# Patient Record
Sex: Male | Born: 1975 | Race: Black or African American | Hispanic: No | Marital: Married | Smoking: Current every day smoker
Health system: Southern US, Community
[De-identification: ages and names within clinical notes are randomized; demographics above are authoritative.]

## PROBLEM LIST (undated history)

## (undated) DIAGNOSIS — I10 Essential (primary) hypertension: Secondary | ICD-10-CM

---

## 2011-02-16 ENCOUNTER — Emergency Department (HOSPITAL_COMMUNITY): Payer: BC Managed Care – PPO

## 2011-02-16 ENCOUNTER — Emergency Department (HOSPITAL_COMMUNITY)
Admission: EM | Admit: 2011-02-16 | Discharge: 2011-02-16 | Disposition: A | Discharge status: Home / Self Care | Payer: BC Managed Care – PPO | Attending: Emergency Medicine | Admitting: Emergency Medicine

## 2011-02-16 DIAGNOSIS — R1031 Right lower quadrant pain: Secondary | ICD-10-CM | POA: Insufficient documentation

## 2011-02-16 DIAGNOSIS — Z79899 Other long term (current) drug therapy: Secondary | ICD-10-CM | POA: Insufficient documentation

## 2011-02-16 DIAGNOSIS — I10 Essential (primary) hypertension: Secondary | ICD-10-CM | POA: Insufficient documentation

## 2011-02-16 DIAGNOSIS — K117 Disturbances of salivary secretion: Secondary | ICD-10-CM | POA: Insufficient documentation

## 2011-02-16 DIAGNOSIS — E7800 Pure hypercholesterolemia, unspecified: Secondary | ICD-10-CM | POA: Insufficient documentation

## 2011-02-16 DIAGNOSIS — N201 Calculus of ureter: Secondary | ICD-10-CM | POA: Insufficient documentation

## 2011-02-16 LAB — URINALYSIS, ROUTINE W REFLEX MICROSCOPIC
Bilirubin Urine: NEGATIVE
Glucose, UA: NEGATIVE mg/dL
Ketones, ur: NEGATIVE mg/dL
Leukocytes, UA: NEGATIVE
Nitrite: NEGATIVE
Protein, ur: NEGATIVE mg/dL
Specific Gravity, Urine: 1.014 (ref 1.005–1.030)
Urobilinogen, UA: 0.2 mg/dL (ref 0.0–1.0)
pH: 6.5 (ref 5.0–8.0)

## 2011-02-16 LAB — URINE MICROSCOPIC-ADD ON

## 2016-10-23 ENCOUNTER — Encounter (HOSPITAL_BASED_OUTPATIENT_CLINIC_OR_DEPARTMENT_OTHER): Payer: Self-pay | Admitting: *Deleted

## 2016-10-23 ENCOUNTER — Emergency Department (HOSPITAL_BASED_OUTPATIENT_CLINIC_OR_DEPARTMENT_OTHER)
Admission: EM | Admit: 2016-10-23 | Discharge: 2016-10-23 | Disposition: A | Discharge status: Home / Self Care | Payer: BLUE CROSS/BLUE SHIELD | Attending: Emergency Medicine | Admitting: Emergency Medicine

## 2016-10-23 ENCOUNTER — Emergency Department (HOSPITAL_BASED_OUTPATIENT_CLINIC_OR_DEPARTMENT_OTHER): Payer: BLUE CROSS/BLUE SHIELD

## 2016-10-23 DIAGNOSIS — R69 Illness, unspecified: Secondary | ICD-10-CM

## 2016-10-23 DIAGNOSIS — R05 Cough: Secondary | ICD-10-CM | POA: Diagnosis not present

## 2016-10-23 DIAGNOSIS — R509 Fever, unspecified: Secondary | ICD-10-CM | POA: Insufficient documentation

## 2016-10-23 DIAGNOSIS — R0981 Nasal congestion: Secondary | ICD-10-CM | POA: Insufficient documentation

## 2016-10-23 DIAGNOSIS — J111 Influenza due to unidentified influenza virus with other respiratory manifestations: Secondary | ICD-10-CM

## 2016-10-23 DIAGNOSIS — F17200 Nicotine dependence, unspecified, uncomplicated: Secondary | ICD-10-CM | POA: Diagnosis not present

## 2016-10-23 DIAGNOSIS — I10 Essential (primary) hypertension: Secondary | ICD-10-CM | POA: Diagnosis not present

## 2016-10-23 DIAGNOSIS — R0989 Other specified symptoms and signs involving the circulatory and respiratory systems: Secondary | ICD-10-CM | POA: Insufficient documentation

## 2016-10-23 HISTORY — DX: Essential (primary) hypertension: I10

## 2016-10-23 LAB — CBC WITH DIFFERENTIAL/PLATELET
Basophils Absolute: 0 10*3/uL (ref 0.0–0.1)
Basophils Relative: 0 %
Eosinophils Absolute: 0.3 10*3/uL (ref 0.0–0.7)
Eosinophils Relative: 3 %
HCT: 46.4 % (ref 39.0–52.0)
Hemoglobin: 15.7 g/dL (ref 13.0–17.0)
Lymphocytes Relative: 26 %
Lymphs Abs: 2.4 10*3/uL (ref 0.7–4.0)
MCH: 27.5 pg (ref 26.0–34.0)
MCHC: 33.8 g/dL (ref 30.0–36.0)
MCV: 81.4 fL (ref 78.0–100.0)
Monocytes Absolute: 1.2 10*3/uL — ABNORMAL HIGH (ref 0.1–1.0)
Monocytes Relative: 13 %
Neutro Abs: 5.2 10*3/uL (ref 1.7–7.7)
Neutrophils Relative %: 58 %
Platelets: 211 10*3/uL (ref 150–400)
RBC: 5.7 MIL/uL (ref 4.22–5.81)
RDW: 14.6 % (ref 11.5–15.5)
WBC: 9 10*3/uL (ref 4.0–10.5)

## 2016-10-23 LAB — COMPREHENSIVE METABOLIC PANEL
ALT: 21 U/L (ref 17–63)
AST: 28 U/L (ref 15–41)
Albumin: 4.6 g/dL (ref 3.5–5.0)
Alkaline Phosphatase: 47 U/L (ref 38–126)
Anion gap: 9 (ref 5–15)
BUN: 11 mg/dL (ref 6–20)
CO2: 29 mmol/L (ref 22–32)
Calcium: 9.4 mg/dL (ref 8.9–10.3)
Chloride: 98 mmol/L — ABNORMAL LOW (ref 101–111)
Creatinine, Ser: 1.3 mg/dL — ABNORMAL HIGH (ref 0.61–1.24)
GFR calc Af Amer: >60 mL/min (ref >60)
GFR calc non Af Amer: >60 mL/min (ref >60)
Glucose, Bld: 97 mg/dL (ref 65–99)
Potassium: 3.1 mmol/L — ABNORMAL LOW (ref 3.5–5.1)
Sodium: 136 mmol/L (ref 135–145)
Total Bilirubin: 0.9 mg/dL (ref 0.3–1.2)
Total Protein: 8.2 g/dL — ABNORMAL HIGH (ref 6.5–8.1)

## 2016-10-23 LAB — TROPONIN I: Troponin I: <0.03 ng/mL (ref <0.03)

## 2016-10-23 MED ORDER — AMLODIPINE BESYLATE 10 MG PO TABS: 10.0000 mg | ORAL_TABLET | Freq: Every day | ORAL | 0 refills | Status: AC

## 2016-10-23 MED ORDER — ALBUTEROL SULFATE (2.5 MG/3ML) 0.083% IN NEBU
5.0000 mg | INHALATION_SOLUTION | Freq: Once | RESPIRATORY_TRACT | Status: AC
  Administered 2016-10-23: 5 mg via RESPIRATORY_TRACT
  Filled 2016-10-23: qty 6

## 2016-10-23 MED ORDER — ALBUTEROL SULFATE HFA 108 (90 BASE) MCG/ACT IN AERS
1.0000 | INHALATION_SPRAY | Freq: Once | RESPIRATORY_TRACT | Status: AC
  Administered 2016-10-23: 1 via RESPIRATORY_TRACT
  Filled 2016-10-23: qty 6.7

## 2016-10-23 MED ORDER — OSELTAMIVIR PHOSPHATE 75 MG PO CAPS: 75.0000 mg | ORAL_CAPSULE | Freq: Two times a day (BID) | ORAL | 0 refills | Status: DC

## 2016-10-23 MED FILL — AMLODIPINE BESYLATE 10 MG T: 10 | 30 days supply | Qty: 30 | Fill #0

## 2016-10-23 MED FILL — OSELTAMIVIR PHOS 75 MG CAP: 75 | 5 days supply | Qty: 10 | Fill #0

## 2016-10-23 NOTE — ED Triage Notes (Signed)
Pt reports hx hypertension, does not have a pcp and stopped taking his medications "years ago", pt counseled on importance of taking his htn meds and not taking otc cold medications except coricidin hbp.

## 2016-10-23 NOTE — ED Provider Notes (Signed)
[facility] DEPT Provider Note   CSN: [id number] Arrival date & time: 10/23/16  1016     History   Chief Complaint Chief Complaint  Patient presents with  . Hypertension    HPI Henry Cooper is a 41 y.o. male.  The history is provided by the patient. No language interpreter was used.  Hypertension    Henry Cooper is a 41 y.o. male who presents to the Emergency Department complaining of HTN.  He has a history of hypertension but has been off medications for several years. On Friday he developed nasal congestion and runny nose. Yesterday he developed body aches with subjective fevers and cough productive of yellow and clear sputum. He went to urgent care today and he was referred to the emergency Department due to elevated blood pressure. He has associated chest pain and tightness described as a burning sensation. No lower extremity swelling or pain. No known sick contacts. Past Medical History:  Diagnosis Date  . Hypertension     There are no active problems to display for this patient.   History reviewed. No pertinent surgical history.     Home Medications    Prior to Admission medications   Medication Sig Start Date End Date Taking? Authorizing Provider  amLODipine (NORVASC) 10 MG tablet Take 1 tablet (10 mg total) by mouth daily. 10/23/16   Quintella Reichert, MD  oseltamivir (TAMIFLU) 75 MG capsule Take 1 capsule (75 mg total) by mouth every 12 (twelve) hours. 10/23/16   Quintella Reichert, MD    Family History History reviewed. No pertinent family history.  Social History Social History  Substance Use Topics  . Smoking status: Current Every Day Smoker  . Smokeless tobacco: Never Used  . Alcohol use Not on file     Allergies   Patient has no known allergies.   Review of Systems Review of Systems  All other systems reviewed and are negative.    Physical Exam Updated Vital Signs BP (!) 193/123   Pulse 99   Temp 99.6 F (37.6 C) ([place])   Resp 23   Ht 5' 10"   (1.778 m)   Wt 250 lb (113.4 kg)   SpO2 97%   BMI 35.87 kg/m   Physical Exam  Constitutional: He is oriented to person, place, and time. He appears well-developed and well-nourished.  HENT:  Head: Normocephalic and atraumatic.  Cardiovascular: Normal rate and regular rhythm.   No murmur heard. Pulmonary/Chest: Effort normal. No respiratory distress.  Rhonchi bilaterally  Abdominal: Soft. There is no tenderness. There is no rebound and no guarding.  Musculoskeletal: He exhibits no edema or tenderness.  Neurological: He is alert and oriented to person, place, and time.  Skin: Skin is warm and dry.  Psychiatric: He has a normal mood and affect. His behavior is normal.  Nursing note and vitals reviewed.    ED Treatments / Results  Labs (all labs ordered are listed, but only abnormal results are displayed) Labs Reviewed  COMPREHENSIVE METABOLIC PANEL - Abnormal; Notable for the following:       Result Value   Potassium 3.1 (*)    Chloride 98 (*)    Creatinine, Ser 1.30 (*)    Total Protein 8.2 (*)    All other components within normal limits  CBC WITH DIFFERENTIAL/PLATELET - Abnormal; Notable for the following:    Monocytes Absolute 1.2 (*)    All other components within normal limits  TROPONIN I    EKG  EKG Interpretation  Date/Time:  Wednesday  October 23 2016 10:27:52 EST Ventricular Rate:  92 PR Interval:  168 QRS Duration: 98 QT Interval:  362 QTC Calculation: 447 R Axis:   85 Text Interpretation:  Normal sinus rhythm Normal ECG Confirmed by Hazle Coca [phone]) on 10/23/2016 10:31:33 AM       Radiology Dg Chest 2 View  Result Date: 10/23/2016 CLINICAL DATA:  Cough, fever, and chest congestion for several days. Current smoker. History of hypertension. EXAM: CHEST  2 VIEW COMPARISON:  None in PACs FINDINGS: The lungs are adequately inflated and clear. The heart and pulmonary vascularity are normal. The mediastinum is normal in width. The trachea is midline. There is  no pleural effusion. The bony thorax exhibits no acute abnormality. IMPRESSION: There is no acute cardiopulmonary abnormality. Electronically Signed   By: David  Martinique M.D.   On: 10/23/2016 12:19    Procedures Procedures (including critical care time)  Medications Ordered in ED Medications  albuterol (PROVENTIL) (2.5 MG/3ML) 0.083% nebulizer solution 5 mg (5 mg Nebulization Given 10/23/16 1250)  albuterol (PROVENTIL HFA;VENTOLIN HFA) 108 (90 Base) MCG/ACT inhaler 1 puff (1 puff Inhalation Given 10/23/16 1358)     Initial Impression / Assessment and Plan / ED Course  I have reviewed the triage vital signs and the nursing notes.  Pertinent labs & imaging results that were available during my care of the patient were reviewed by me and considered in my medical decision making (see chart for details).  Clinical Course     Pt with hx/o HTN here with elevated BP as well as upper respiratory sxs.  Current clinical picture is not c/w hypertensive urgency, CHF, ACS, PE.  No evidence of pna on exam or CXR.  D/w pt likely viral URI, possible influenza - will treat symptomatically with tamiflu.  In terms of HTN, will restart one of his prior home meds (he was on amlodipine, HCTZ, and spironolactone).  Discussed home care for URI and HTN as well as importance of PCP follow up.  Return precautions discussed.    Final Clinical Impressions(s) / ED Diagnoses   Final diagnoses:  Essential hypertension  Influenza-like illness    New Prescriptions Discharge Medication List as of 10/23/2016  1:26 PM    START taking these medications   Details  amLODipine (NORVASC) 10 MG tablet Take 1 tablet (10 mg total) by mouth daily., Starting Wed 10/23/2016, Print    oseltamivir (TAMIFLU) 75 MG capsule Take 1 capsule (75 mg total) by mouth every 12 (twelve) hours., Starting Wed 10/23/2016, Print         Quintella Reichert, MD 10/24/16 [phone]

## 2016-10-23 NOTE — ED Triage Notes (Signed)
Pt reports cold sx x 1 week, took mucinex last night and today his bp was elevated at urgent care so they told him to come here.

## 2017-02-20 ENCOUNTER — Other Ambulatory Visit: Payer: Self-pay | Admitting: Internal Medicine

## 2017-02-20 DIAGNOSIS — N183 Chronic kidney disease, stage 3 unspecified: Secondary | ICD-10-CM

## 2017-02-28 ENCOUNTER — Ambulatory Visit
Admission: RE | Admit: 2017-02-28 | Discharge: 2017-02-28 | Disposition: A | Discharge status: Home / Self Care | Payer: BLUE CROSS/BLUE SHIELD | Source: Ambulatory Visit | Attending: Internal Medicine | Admitting: Internal Medicine

## 2017-02-28 DIAGNOSIS — N183 Chronic kidney disease, stage 3 unspecified: Secondary | ICD-10-CM

## 2018-02-23 IMAGING — DX DG CHEST 2V
2 series · 2 of 2 positions shown · non-contrast
Comparison: None in PACs

CLINICAL DATA: Cough, fever, and chest congestion for several days.
Current smoker. History of hypertension.

EXAM:
CHEST  2 VIEW

[chest pa]
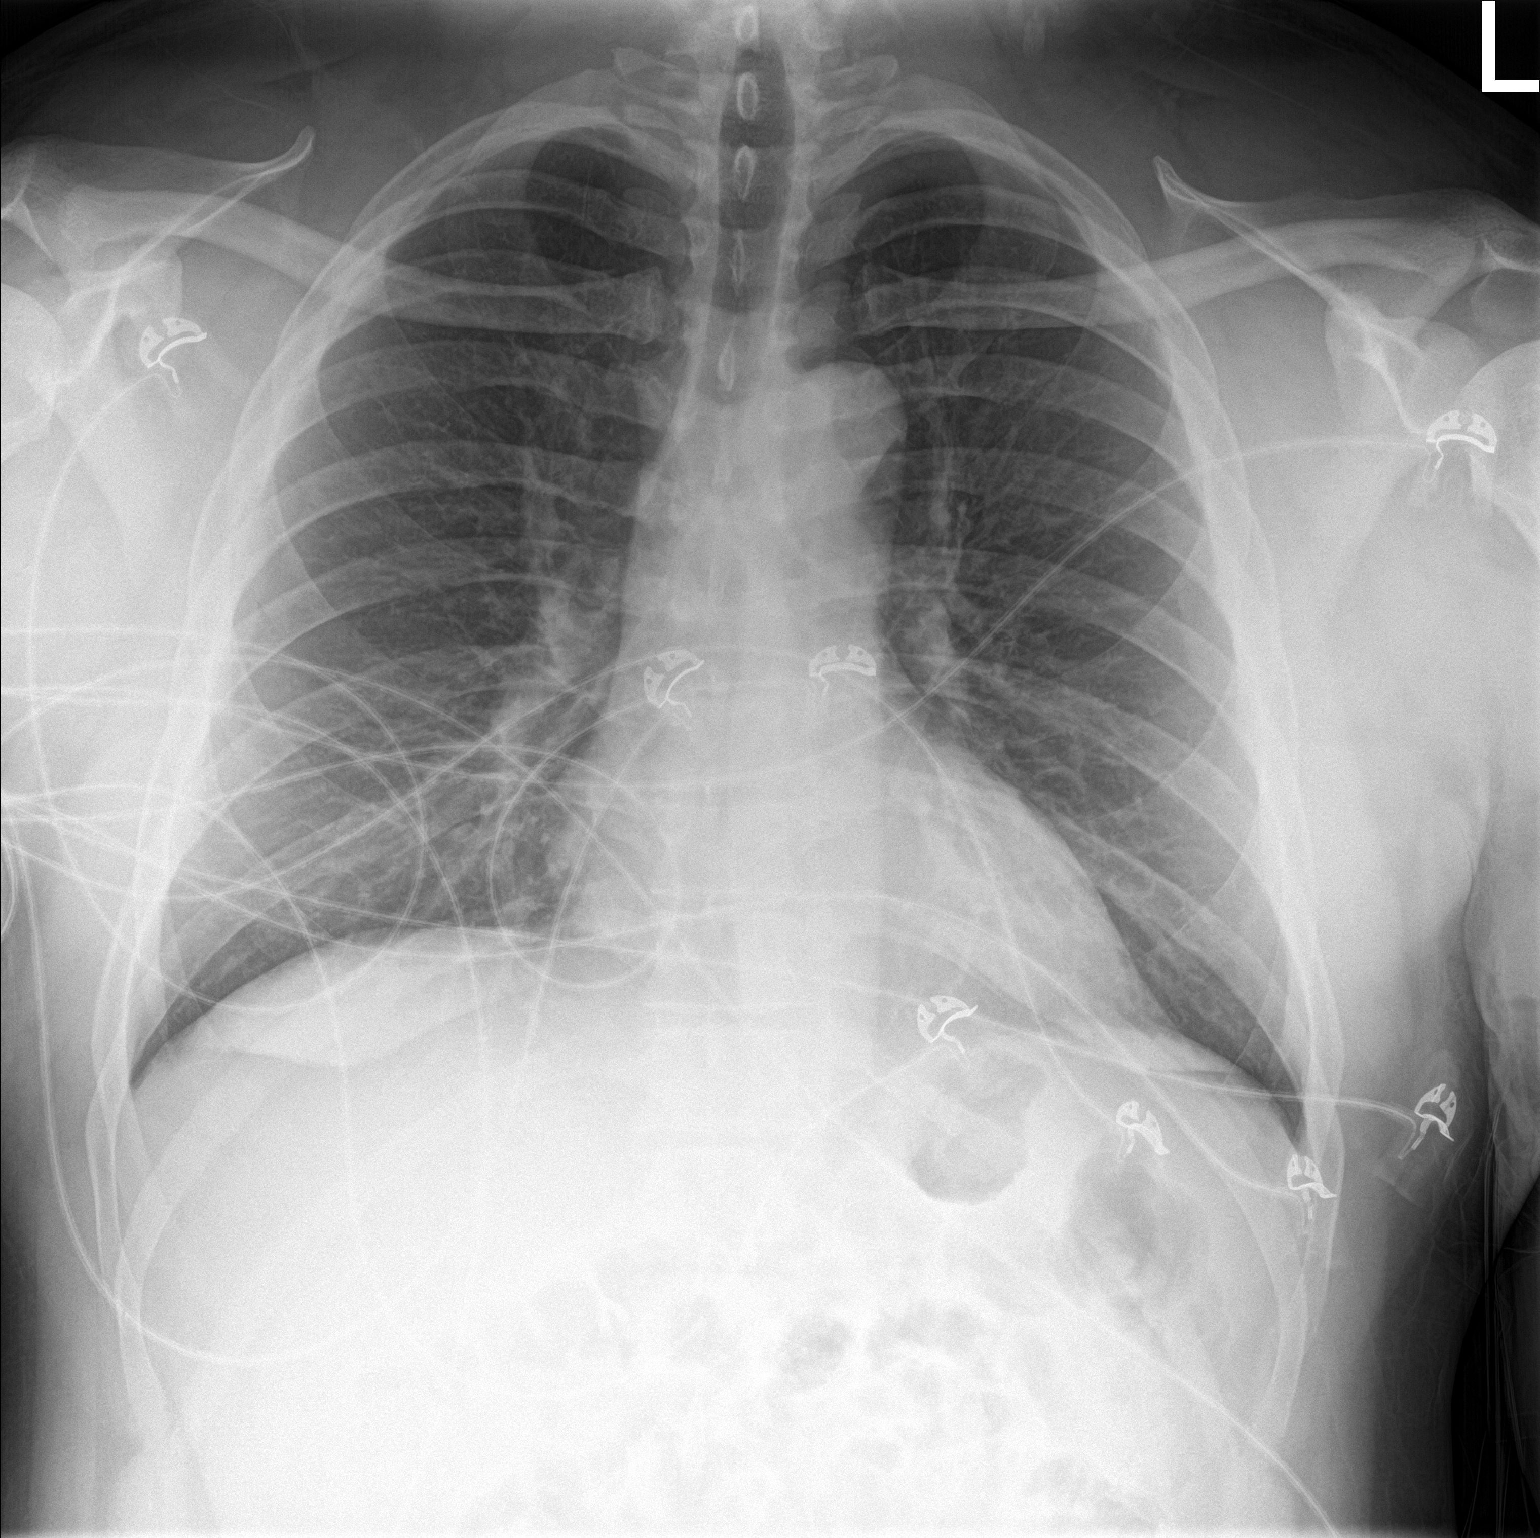

[chest lat]
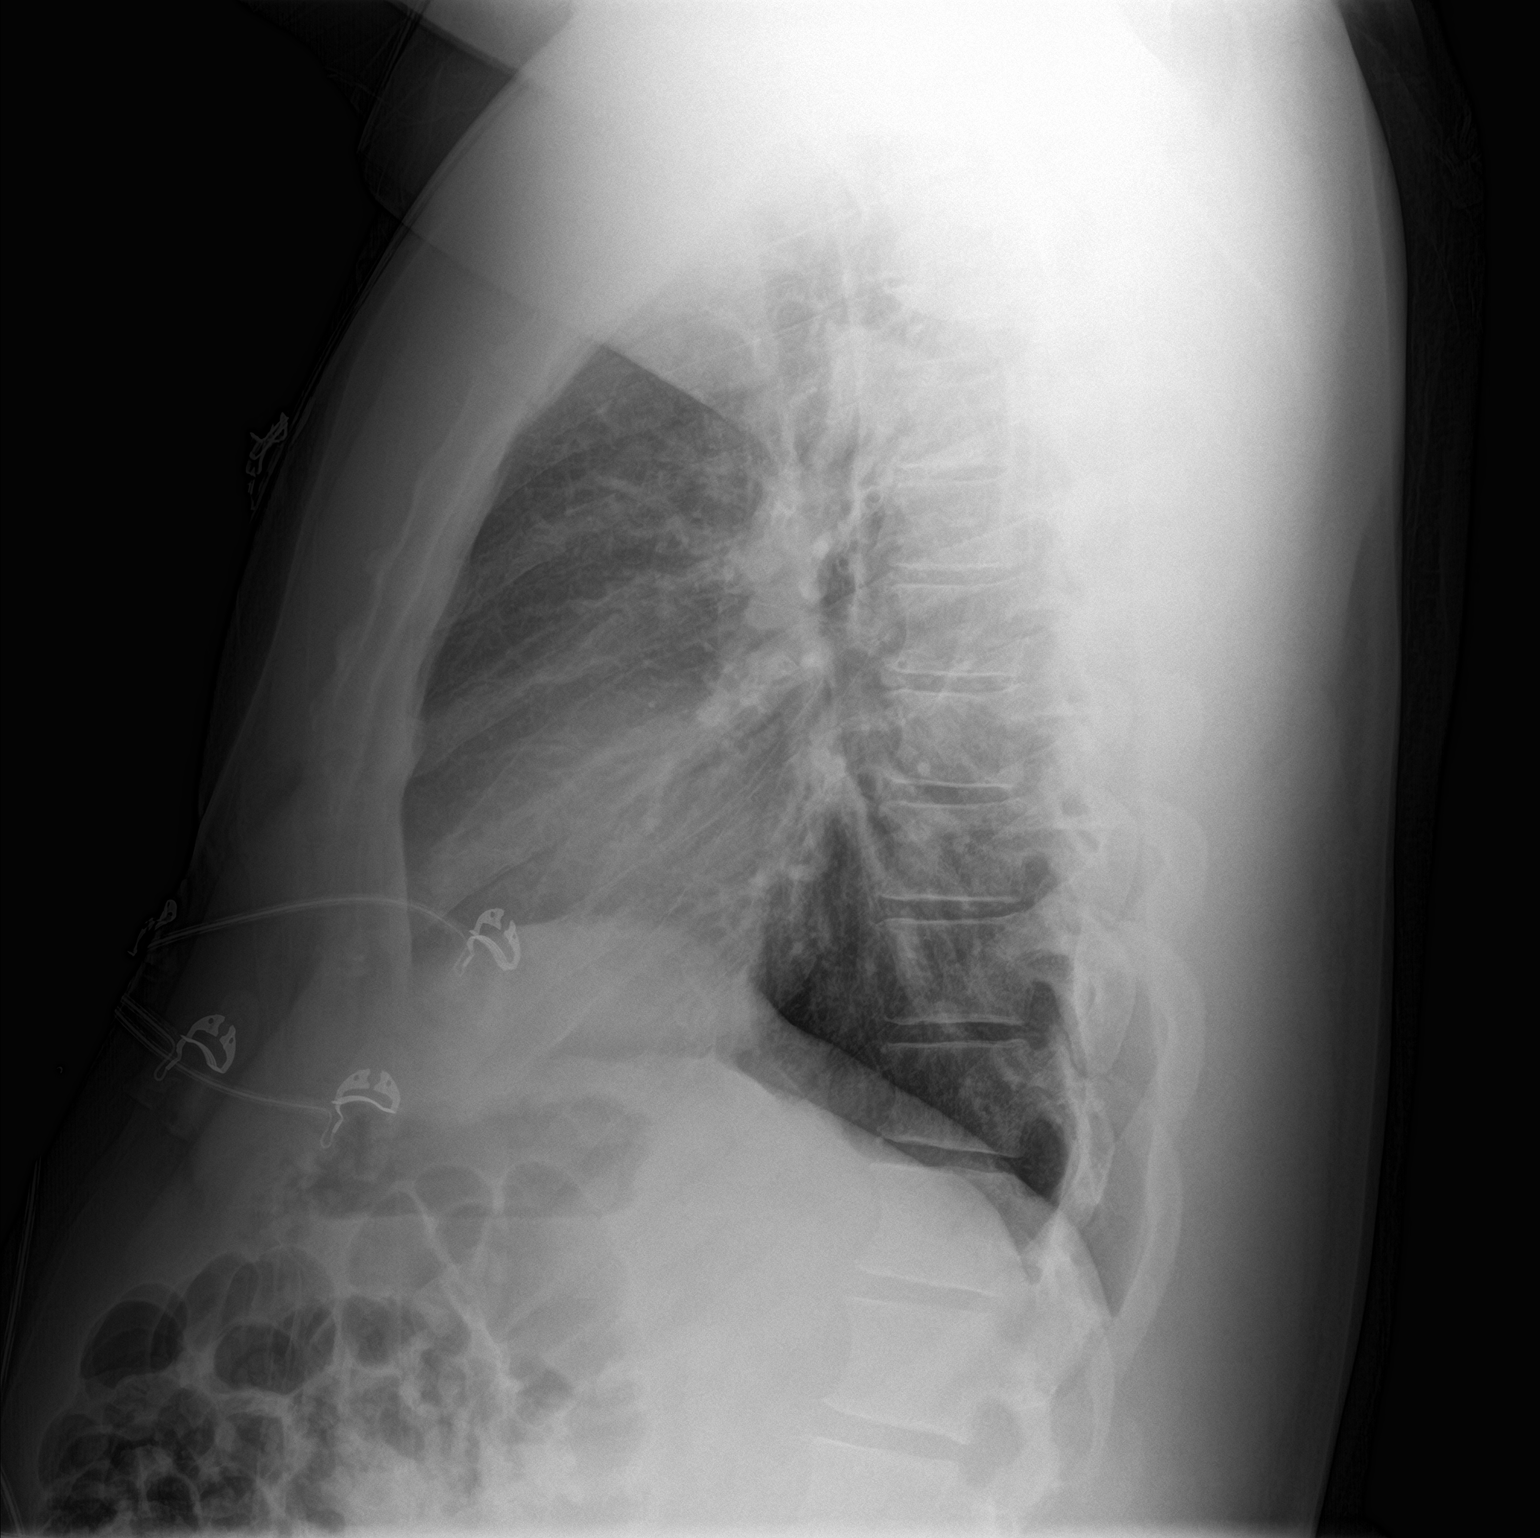

[2 of 2 positions shown; findings below may reference images not displayed]

FINDINGS: The lungs are adequately inflated and clear. The heart and pulmonary
vascularity are normal. The mediastinum is normal in width. The
trachea is midline. There is no pleural effusion. The bony thorax
exhibits no acute abnormality.
IMPRESSION: There is no acute cardiopulmonary abnormality.

## 2021-03-03 ENCOUNTER — Emergency Department (HOSPITAL_BASED_OUTPATIENT_CLINIC_OR_DEPARTMENT_OTHER)
Admission: EM | Admit: 2021-03-03 | Discharge: 2021-03-04 | Disposition: A | Discharge status: Home / Self Care | Payer: BC Managed Care – PPO | Attending: Emergency Medicine | Admitting: Emergency Medicine

## 2021-03-03 ENCOUNTER — Encounter (HOSPITAL_BASED_OUTPATIENT_CLINIC_OR_DEPARTMENT_OTHER): Payer: Self-pay

## 2021-03-03 ENCOUNTER — Other Ambulatory Visit: Payer: Self-pay

## 2021-03-03 ENCOUNTER — Emergency Department (HOSPITAL_BASED_OUTPATIENT_CLINIC_OR_DEPARTMENT_OTHER): Payer: BC Managed Care – PPO

## 2021-03-03 DIAGNOSIS — Z79899 Other long term (current) drug therapy: Secondary | ICD-10-CM | POA: Diagnosis not present

## 2021-03-03 DIAGNOSIS — F17200 Nicotine dependence, unspecified, uncomplicated: Secondary | ICD-10-CM | POA: Insufficient documentation

## 2021-03-03 DIAGNOSIS — I10 Essential (primary) hypertension: Secondary | ICD-10-CM | POA: Diagnosis not present

## 2021-03-03 DIAGNOSIS — R1032 Left lower quadrant pain: Secondary | ICD-10-CM | POA: Diagnosis present

## 2021-03-03 DIAGNOSIS — N200 Calculus of kidney: Secondary | ICD-10-CM | POA: Diagnosis not present

## 2021-03-03 LAB — BASIC METABOLIC PANEL
Anion gap: 11 (ref 5–15)
BUN: 15 mg/dL (ref 6–20)
CO2: 26 mmol/L (ref 22–32)
Calcium: 9.2 mg/dL (ref 8.9–10.3)
Chloride: 101 mmol/L (ref 98–111)
Creatinine, Ser: 1.54 mg/dL — ABNORMAL HIGH (ref 0.61–1.24)
GFR, Estimated: 57 mL/min — ABNORMAL LOW (ref >60)
Glucose, Bld: 105 mg/dL — ABNORMAL HIGH (ref 70–99)
Potassium: 3.1 mmol/L — ABNORMAL LOW (ref 3.5–5.1)
Sodium: 138 mmol/L (ref 135–145)

## 2021-03-03 LAB — URINALYSIS, ROUTINE W REFLEX MICROSCOPIC
Bilirubin Urine: NEGATIVE
Glucose, UA: NEGATIVE mg/dL
Ketones, ur: NEGATIVE mg/dL
Leukocytes,Ua: NEGATIVE
Nitrite: NEGATIVE
Protein, ur: NEGATIVE mg/dL
Specific Gravity, Urine: 1.01 (ref 1.005–1.030)
pH: 7 (ref 5.0–8.0)

## 2021-03-03 LAB — CBC
HCT: 45.5 % (ref 39.0–52.0)
Hemoglobin: 15.5 g/dL (ref 13.0–17.0)
MCH: 28.1 pg (ref 26.0–34.0)
MCHC: 34.1 g/dL (ref 30.0–36.0)
MCV: 82.4 fL (ref 80.0–100.0)
Platelets: 231 10*3/uL (ref 150–400)
RBC: 5.52 MIL/uL (ref 4.22–5.81)
RDW: 14.7 % (ref 11.5–15.5)
WBC: 12.7 10*3/uL — ABNORMAL HIGH (ref 4.0–10.5)
nRBC: 0 % (ref 0.0–0.2)

## 2021-03-03 LAB — URINALYSIS, MICROSCOPIC (REFLEX)

## 2021-03-03 NOTE — ED Triage Notes (Signed)
Reports left flank pain. History of kidney stones and this feels the same. Denies dysuria, N/V/D.

## 2021-03-03 NOTE — ED Provider Notes (Signed)
[facility] EM[facility]GENCY DEPARTMENT Provider Note   CSN: [id number] Arrival date & time: 03/03/21  1916     History Chief Complaint  Patient presents with  . Flank Pain    Henry Cooper is a 45 y.o. male with PMH of HTN and obstructing ureterolithiasis who presents the ED with complaints of left-sided flank pain.  Patient reports that he had just left his son's track meet and was at home laying down for a nap when he suddenly developed sudden onset 10 out of 10 left-sided flank pain that radiated into his left lower quadrant.  He denies any associated nausea or vomiting.  He states that his symptoms have been waxing and waning.  He describes it as sharp.  He states this feels comparable to his prior obstructing kidney stone.  He has not had any recent laboratory work-up completed.  Denies any recent fevers, chills, dysuria, or any other symptoms.  HPI     Past Medical History:  Diagnosis Date  . Hypertension     There are no problems to display for this patient.   History reviewed. No pertinent surgical history.     History reviewed. No pertinent family history.  Social History   Tobacco Use  . Smoking status: Current Every Day Smoker  . Smokeless tobacco: Never Used    Home Medications Prior to Admission medications   Medication Sig Start Date End Date Taking? Authorizing Provider  ondansetron (ZOFRAN ODT) 4 MG disintegrating tablet Take 1 tablet (4 mg total) by mouth every 8 (eight) hours as needed for nausea or vomiting. 03/04/21  Yes Corena Herter, PA-C  oxyCODONE (OXY IR/ROXICODONE) 5 MG immediate release tablet Take 1 tablet (5 mg total) by mouth every 6 (six) hours as needed for severe pain or breakthrough pain. 03/04/21  Yes Corena Herter, PA-C  amLODipine (NORVASC) 10 MG tablet Take 1 tablet (10 mg total) by mouth daily. 10/23/16   Quintella Reichert, MD  oseltamivir (TAMIFLU) 75 MG capsule Take 1 capsule (75 mg total) by mouth every 12 (twelve) hours.  10/23/16   Quintella Reichert, MD    Allergies    Codeine  Review of Systems   Review of Systems  All other systems reviewed and are negative.   Physical Exam Updated Vital Signs BP (!) 194/111 (BP Location: Left Arm)   Pulse 84   Temp 98.7 F (37.1 C) ([place])   Resp 19   Ht 5' 10"  (1.778 m)   Wt 113.4 kg   SpO2 95%   BMI 35.87 kg/m   Physical Exam Vitals and nursing note reviewed. Exam conducted with a chaperone present.  Constitutional:      Appearance: Normal appearance.  HENT:     Head: Normocephalic and atraumatic.  Eyes:     General: No scleral icterus.    Conjunctiva/sclera: Conjunctivae normal.  Cardiovascular:     Rate and Rhythm: Normal rate.     Pulses: Normal pulses.  Pulmonary:     Effort: Pulmonary effort is normal. No respiratory distress.  Abdominal:     General: Abdomen is flat. There is no distension.     Palpations: Abdomen is soft.     Tenderness: There is no abdominal tenderness. There is no right CVA tenderness, left CVA tenderness or guarding.     Comments: Soft, nondistended.  No areas of significant TTP.  No overlying skin changes.  No guarding.  Negative CVAT bilaterally.  Musculoskeletal:     Cervical back: Normal range of motion.  No rigidity.  Skin:    General: Skin is dry.  Neurological:     Mental Status: He is alert.     GCS: GCS eye subscore is 4. GCS verbal subscore is 5. GCS motor subscore is 6.  Psychiatric:        Mood and Affect: Mood normal.        Behavior: Behavior normal.        Thought Content: Thought content normal.     ED Results / Procedures / Treatments   Labs (all labs ordered are listed, but only abnormal results are displayed) Labs Reviewed  URINALYSIS, ROUTINE W REFLEX MICROSCOPIC - Abnormal; Notable for the following components:      Result Value   Color, Urine STRAW (*)    Hgb urine dipstick SMALL (*)    All other components within normal limits  URINALYSIS, MICROSCOPIC (REFLEX) - Abnormal; Notable for  the following components:   Bacteria, UA FEW (*)    All other components within normal limits  CBC - Abnormal; Notable for the following components:   WBC 12.7 (*)    All other components within normal limits  BASIC METABOLIC PANEL - Abnormal; Notable for the following components:   Potassium 3.1 (*)    Glucose, Bld 105 (*)    Creatinine, Ser 1.54 (*)    GFR, Estimated 57 (*)    All other components within normal limits    EKG None  Radiology CT Renal Stone Study  Result Date: 03/03/2021 CLINICAL DATA:  Flank pain kidney stone suspected. EXAM: CT ABDOMEN AND PELVIS WITHOUT CONTRAST TECHNIQUE: Multidetector CT imaging of the abdomen and pelvis was performed following the standard protocol without IV contrast. COMPARISON:  CT February 16, 2011 FINDINGS: Lower chest: No acute abnormality. Normal size heart. No significant pericardial effusion/thickening. Small hiatal hernia. Hepatobiliary: Unremarkable noncontrast appearance of the hepatic parenchyma. Gallbladder is unremarkable. No biliary ductal dilation. Pancreas: Within normal limits. Spleen: Within normal limits. Adrenals/Urinary Tract: Bilateral adrenal glands are unremarkable. Edematous appearance of the left kidney with periureteric stranding and mild prominence of the collecting system. Right kidneys unremarkable. No renal, ureteral or bladder calculi visualized. Urinary bladder is grossly unremarkable for degree of distension. Stomach/Bowel: Small hiatal hernia otherwise the stomach is grossly unremarkable for degree of distension. Normal positioning of the duodenum/ligament of [place]itz. No pathologic dilation of small bowel. The appendix and terminal ileum within normal limits. Scattered left-sided colonic diverticula without findings of acute diverticulitis. Vascular/Lymphatic: Aortic atherosclerosis. No enlarged abdominal or pelvic lymph nodes. Reproductive: Prostate is unremarkable. Other: No abdominopelvic ascites.  No pneumoperitoneum.  Musculoskeletal: No acute osseous abnormality. IMPRESSION: 1. Edematous appearance of the left kidney with periureteric stranding and mild prominence of the collecting system. No renal, ureteral, or bladder calculi visualized. Findings may reflect a recently passed stone or ascending urinary tract infection. Correlation with urinalysis recommended. 2. Colonic diverticulosis without findings of acute diverticulitis. 3. Small hiatal hernia. 4. Aortic atherosclerosis. Aortic Atherosclerosis (ICD10-I70.0). Electronically Signed   By: Dahlia Bailiff MD   On: 03/03/2021 23:18    Procedures Procedures   Medications Ordered in ED Medications  potassium chloride SA (KLOR-CON) CR tablet 40 mEq (has no administration in time range)  ketorolac (TORADOL) 30 MG/ML injection 30 mg (has no administration in time range)    ED Course  I have reviewed the triage vital signs and the nursing notes.  Pertinent labs & imaging results that were available during my care of the patient were reviewed by me and  considered in my medical decision making (see chart for details).    MDM Rules/Calculators/A&P                          Henry Cooper was evaluated in Emergency Department on 03/04/2021 for the symptoms described in the history of present illness. He was evaluated in the context of the global COVID-19 pandemic, which necessitated consideration that the patient might be at risk for infection with the SARS-CoV-2 virus that causes COVID-19. Institutional protocols and algorithms that pertain to the evaluation of patients at risk for COVID-19 are in a state of rapid change based on information released by regulatory bodies including the CDC and federal and state organizations. These policies and algorithms were followed during the patient's care in the ED.  I personally reviewed patient's medical chart and all notes from triage and staff during today's encounter. I have also ordered and reviewed all labs and imaging that  I felt to be medically necessary in the evaluation of this patient's complaints and with consideration of their physical exam. If needed, translation services were available and utilized.   Patient's history and physical exam is suggestive of obstructing ureterolithiasis.  He states that his onset of pain was at 4 PM today and was at a time.  Currently his pain is only 4 out of 10.  He states that codeine makes him itchy.  Laboratory work-up notable for mild renal impairment with creatinine of 1.54.  I reviewed his medical record and it seems that this is likely chronic worsening of his CKD.  Doubt AKI.  BUN preserved.  UA without concern for infection.  Small amount hematuria.  Very mild leukocytosis to 12.7.  He denies any fevers, afebrile here.  CT renal stone study demonstrates edematous appearance of left kidney with Henrene Pastor ureteric stranding and mild prominence of the collecting system.  There are no stones visualized.  This is likely reflective of a recently passed stone versus a sending UTI.  No evidence to suggest UTI, history more likely suggestive of stone passage.  Will encourage increased Ching hydration and 600 mg ibuprofen every 6 hours.  Encouraged him to follow-up with his primary care provider regarding today's ED encounter and for ongoing evaluation and management.  He may ultimately benefit from referral to urology.  I will also discharge him home with a very short course of oxycodone for breakthrough pain.  ER return precautions discussed.  Patient voices understanding and is agreeable to the plan.  The patient was counseled on the dangers of tobacco use, and was advised to quit.  Reviewed strategies to maximize success, including removing cigarettes and smoking materials from environment, stress management, substitution of other forms of reinforcement, support of family/friends and written materials. Total time was 5 min CPT code 99406.   I discussed with patient their elevated  blood pressure and need for close outpatient management of their hypertension. Patient counseled on long-term effects of elevated BP including kidney damage, vascular damage, retinopathy, and risk of stroke and other dangerous outcomes. Patient understanding of close PCP follow up.   Final Clinical Impression(s) / ED Diagnoses Final diagnoses:  Kidney stone    Rx / DC Orders ED Discharge Orders         Ordered    oxyCODONE (OXY IR/ROXICODONE) 5 MG immediate release tablet  Every 6 hours PRN        03/04/21 0028    ondansetron (ZOFRAN ODT) 4 MG disintegrating tablet  Every 8 hours PRN        03/04/21 0028           Corena Herter, PA-C 03/04/21 0030    Malvin Johns, MD 03/04/21 [phone]

## 2021-03-04 MED ORDER — OXYCODONE HCL 5 MG PO TABS: 5.0000 mg | ORAL_TABLET | Freq: Four times a day (QID) | ORAL | 0 refills | Status: DC | PRN

## 2021-03-04 MED ORDER — KETOROLAC TROMETHAMINE 30 MG/ML IJ SOLN
30.0000 mg | Freq: Once | INTRAMUSCULAR | Status: AC
  Administered 2021-03-04: 30 mg via INTRAVENOUS
  Filled 2021-03-04: qty 1

## 2021-03-04 MED ORDER — ONDANSETRON 4 MG PO TBDP: 4.0000 mg | ORAL_TABLET | Freq: Three times a day (TID) | ORAL | 0 refills | Status: DC | PRN

## 2021-03-04 MED ORDER — KETOROLAC TROMETHAMINE 60 MG/2ML IM SOLN: 60.0000 mg | Freq: Once | INTRAMUSCULAR | Status: DC

## 2021-03-04 MED ORDER — POTASSIUM CHLORIDE CRYS ER 20 MEQ PO TBCR
40.0000 meq | EXTENDED_RELEASE_TABLET | Freq: Once | ORAL | Status: AC
  Administered 2021-03-04: 40 meq via ORAL
  Filled 2021-03-04: qty 2

## 2021-03-04 NOTE — Discharge Instructions (Signed)
Your work-up today was suggestive of a recently passed kidney stone.  Please take ibuprofen 600 mg or 6 hours as needed for pain control.  You may also take Tylenol 500 mg every 6 hours.  I have prescribed you oxycodone which you can take, as needed, for breakthrough pain.  If it makes you itchy, I recommend Benadryl/other antihistamine.  I have also prescribed you Zofran which can take as needed for nausea symptoms.  I called these into a [facility] in [place], close to your listed address.  There are no kidney stones seen in your kidneys or bladder.  I suspect that you have already passed the stone.  I cannot emphasize enough the importance of outpatient follow-up with a primary care provider.  Your blood pressure was elevated here in the ED, you tell me that this is an ongoing issue.  I highly encourage you to get this better controlled with your primary care provider.  Return to the ER or seek immediate medical attention should you experience any new or worsening symptoms.

## 2021-04-18 ENCOUNTER — Other Ambulatory Visit: Payer: Self-pay

## 2021-04-18 ENCOUNTER — Encounter: Payer: Self-pay | Admitting: Pulmonary Disease

## 2021-04-18 ENCOUNTER — Ambulatory Visit (INDEPENDENT_AMBULATORY_CARE_PROVIDER_SITE_OTHER): Payer: Self-pay | Admitting: Pulmonary Disease

## 2021-04-18 VITALS — BP 160/110 | HR 94 | Temp 98.6°F | Ht 70.0 in | Wt 272.8 lb

## 2021-04-18 DIAGNOSIS — G4733 Obstructive sleep apnea (adult) (pediatric): Secondary | ICD-10-CM

## 2021-04-18 NOTE — Progress Notes (Signed)
[facility]  Chief Complaint  Patient presents with   Consult    Had a sleep study years ago, wears a CPAP.  Machine is still working, but has had it for years and thinks he needs a new one.  Gets a message that the motor has met the life expectancy.    Constitutional:  BP (!) 160/110 (BP Location: Left Arm, Patient Position: Sitting, Cuff Size: Large)   Pulse 94   Temp 98.6 F (37 C) (Henry Cooper)   Ht _0  (1.778 m)   Wt 272 lb 12.8 oz (123.7 kg)   SpO2 93%   BMI 39.14 kg/m   Past Medical History:  Hypertension, Hyperlipidemia, Allergies  Past Surgical History:  He  has no past surgical history on file.  Brief Summary:  Henry Cooper is a 45 y.o. male former smoker with obstructive sleep apnea.      Subjective:   He had sleep study several years ago in [place].  He has CPAP.  Has been buying supplies on his own.  Machine has error message about motor life.  He feels pressure isn't as strong as before.  Using full face mask, but this makes mouth dry.  He goes to sleep at 10 pm.  He falls asleep 30 minutes.  He wakes up several times to use the bathroom.  He gets out of bed at 6 am.  He feels okay in the morning.  He denies morning headache.  He does not use anything to help him fall sleep or stay awake.  He denies sleep walking, sleep talking, bruxism, or nightmares.  There is no history of restless legs.  He denies sleep hallucinations, sleep paralysis, or cataplexy.  The Epworth score is 6 out of 24.   Physical Exam:   Appearance - well kempt   ENMT - no sinus tenderness, no Roxie exudate, no LAN, Mallampati 4 airway, no stridor  Respiratory - equal breath sounds bilaterally, no wheezing or rales  CV - s1s2 regular rate and rhythm, no murmurs  Ext - no clubbing, no edema  Skin - no rashes  Psych - normal mood and affect   Sleep Tests:    Social History:  He  reports that he has been smoking cigarettes. He  started smoking about 2 years ago. He has never used smokeless tobacco.  Family History:  His family history includes Diabetes in his father; Diabetes Mellitus II in his mother; Hypertension in his father and mother; Prostate cancer in his father.     Assessment/Plan:   Obstructive sleep apnea. - he has snoring, sleep disruption, apnea, and daytime sleepiness prior to getting CPAP - he has been compliant with CPAP and reports benefit from therapy - his CPAP motor life has expired - will arrange for home sleep study to assess current status of sleep apnea and then arrange for new set up - he would like to change to nasal mask once he gets set up with a DME  Time Spent Involved in Patient Care on Day of Examination:  31 minutes  Follow up:   Patient Instructions  Will arrange for home sleep study  Follow up in 5 months  Medication List:   Allergies as of 04/18/2021       Reactions   Codeine Itching        Medication List        Accurate as of April 18, 2021  4:05 PM. If you have any questions,  ask your nurse or doctor.          STOP taking these medications    ondansetron 4 MG disintegrating tablet Commonly known as: Zofran ODT Stopped by: Chesley Mires, MD   oseltamivir 75 MG capsule Commonly known as: TAMIFLU Stopped by: Chesley Mires, MD   oxyCODONE 5 MG immediate release tablet Commonly known as: Oxy IR/ROXICODONE Stopped by: Chesley Mires, MD       TAKE these medications    allopurinol 300 MG tablet Commonly known as: ZYLOPRIM Take by mouth.   amLODipine 10 MG tablet Commonly known as: NORVASC Take 1 tablet (10 mg total) by mouth daily.   buPROPion 75 MG tablet Commonly known as: WELLBUTRIN Take 1 tablet by mouth once daily for smoking cessation   hydrALAZINE 10 MG tablet Commonly known as: APRESOLINE Take 10 mg by mouth 2 (two) times daily.   labetalol 200 MG tablet Commonly known as: NORMODYNE Take by mouth.   potassium citrate 10 MEQ  (1080 MG) SR tablet Commonly known as: UROCIT-K Take by mouth.        Signature:  Chesley Mires, MD [facility] Pager - [phone] 04/18/2021, 4:05 PM

## 2021-04-18 NOTE — Patient Instructions (Signed)
Will arrange for home sleep study  Follow up in 5 months

## 2021-06-27 LAB — COLOGUARD: COLOGUARD: NEGATIVE

## 2021-07-02 ENCOUNTER — Telehealth: Payer: Self-pay | Admitting: Pulmonary Disease

## 2021-07-02 DIAGNOSIS — G4733 Obstructive sleep apnea (adult) (pediatric): Secondary | ICD-10-CM

## 2021-07-02 NOTE — Telephone Encounter (Signed)
HST order placed.    Per LOV Dr. Halford Chessman 04/18/21-  Instructions   Return in about 5 months (around 09/18/2021). Will arrange for home sleep study   Follow up in 5 months

## 2021-07-02 NOTE — Telephone Encounter (Signed)
I do not see a order for a sleep study

## 2021-07-02 NOTE — Telephone Encounter (Signed)
I have printed order & will give to [facility] to precert. I put a note on the order that it should have been placed on 6/29 so will go on top of those to be scheduled once precerted.

## 2021-07-05 NOTE — Telephone Encounter (Signed)
This has been authorized and placed in folder to be scheduled Henry Cooper

## 2021-07-30 ENCOUNTER — Other Ambulatory Visit: Payer: Self-pay

## 2021-07-30 ENCOUNTER — Ambulatory Visit: Payer: 59

## 2021-07-30 DIAGNOSIS — G4733 Obstructive sleep apnea (adult) (pediatric): Secondary | ICD-10-CM

## 2021-08-02 ENCOUNTER — Telehealth: Payer: Self-pay | Admitting: Pulmonary Disease

## 2021-08-02 DIAGNOSIS — G4733 Obstructive sleep apnea (adult) (pediatric): Secondary | ICD-10-CM | POA: Diagnosis not present

## 2021-08-02 NOTE — Telephone Encounter (Signed)
HST 07/31/21 >> AHI 28.6, SpO2 low 66%  Please inform him that his sleep study shows moderate obstructive sleep apnea.  Please send order to arrange for new auto CPAP pressure range 5 to 20 cm H2O with heated humidity and mask of choice.  He needs ROV in 4 to 5 months.

## 2021-08-15 NOTE — Addendum Note (Signed)
Addended by: Fritzi Mandes D on: 08/15/2021 04:25 PM   Modules accepted: Orders

## 2021-08-15 NOTE — Telephone Encounter (Signed)
Spoke to patient regarding HST results. He voiced understanding. Patient agrees to call when CPAP is set up to make a follow up appt. Order for cpap placed. Nothing further needed at this time.

## 2021-08-15 NOTE — Telephone Encounter (Signed)
ATC patient. LMTCB with [facility] office number

## 2021-09-03 ENCOUNTER — Telehealth: Payer: Self-pay | Admitting: Pulmonary Disease

## 2021-09-03 NOTE — Telephone Encounter (Signed)
Looks like the results were given to the pt on 10/13.    I have called and LM on VM for the pt to call back.

## 2021-09-11 NOTE — Telephone Encounter (Signed)
Called and spoke with pt to see if he was still needing to know the results of his sleep study. Pt said he had been made aware of the results but is still waiting to receive his CPAP. Order was placed 08/15/21. Stated to pt that I would  check with [employer] about status update and would call him back once I had one and he verbalized understanding.  Community message sent to [employer] for status update. Will update once response received.

## 2021-09-12 NOTE — Telephone Encounter (Signed)
Community message response received: New, Drucilla Chalet, Waldemar Dickens, CMA; Doroteo Bradford,   Per the notes on the account patient is scheduled for 09/20/21 @ 9am with our RT Dominica Severin. Patient was scheduled today.   Thank you,   Osker Mason, Leory Plowman  Vanya Carberry, Waldemar Dickens, [place]; Learta Codding, Elane Fritz,   Per the notes on the account patient is scheduled for 09/20/21 @ 9am with our RT Dominica Severin. Patient was scheduled today.   Thank you,   Brad New      Closing encounter.

## 2022-07-04 IMAGING — CT CT RENAL STONE PROTOCOL
2 of 4 series · 16 of 46 positions shown, 18 images · non-contrast
Comparison: CT February 16, 2011

CLINICAL DATA: Flank pain kidney stone suspected.

EXAM:
CT ABDOMEN AND PELVIS WITHOUT CONTRAST
TECHNIQUE: Multidetector CT imaging of the abdomen and pelvis was performed
following the standard protocol without IV contrast.

[Series 2: axial st · axial · 0.91mm/px · z∈[-593,-158]mm · 13 of 95 slices shown, 15 images]
[im 4/95  soft-tissue]
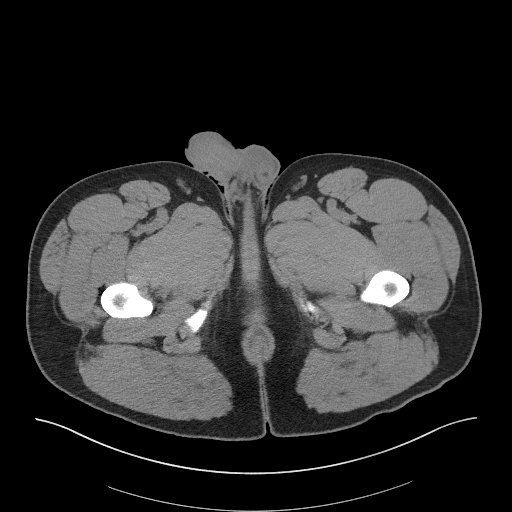
[im 4/95  bone]
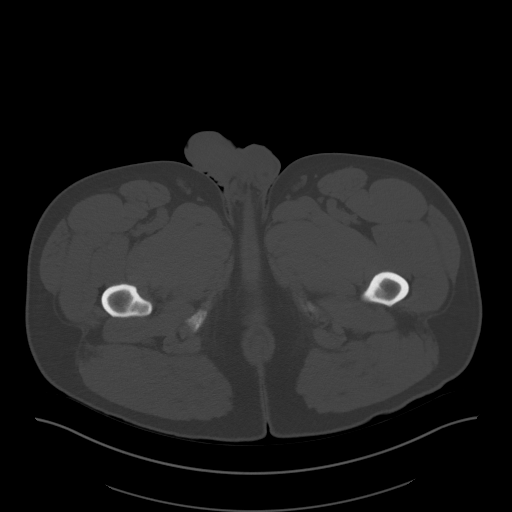
[im 12/95  soft-tissue]
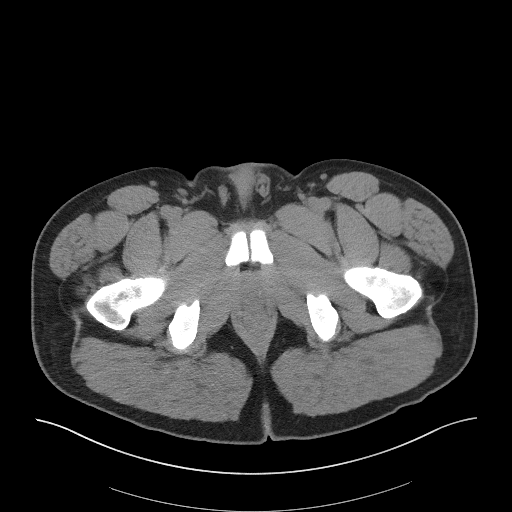
[im 19/95  soft-tissue]
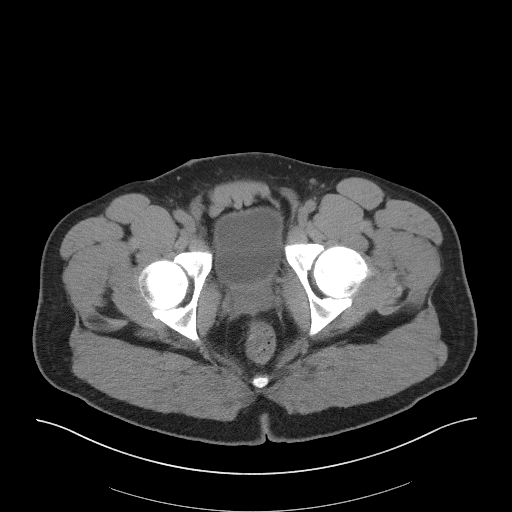
[im 27/95  soft-tissue]
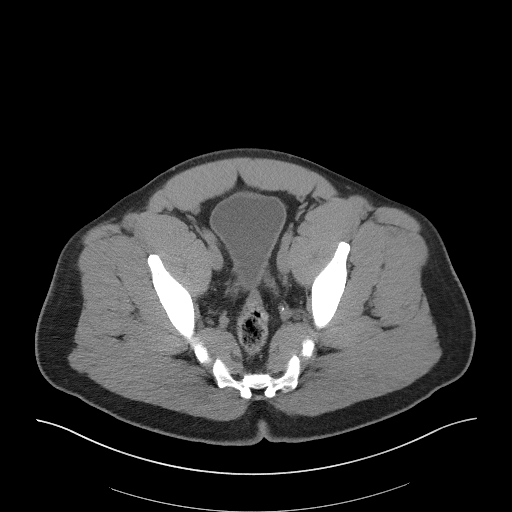
[im 34/95  soft-tissue]
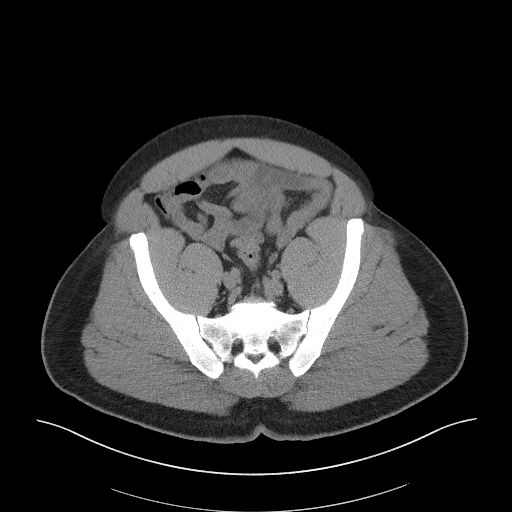
[im 42/95  soft-tissue]
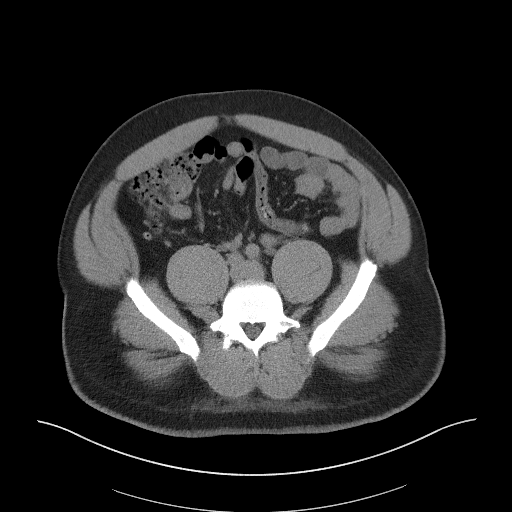
[im 49/95  soft-tissue]
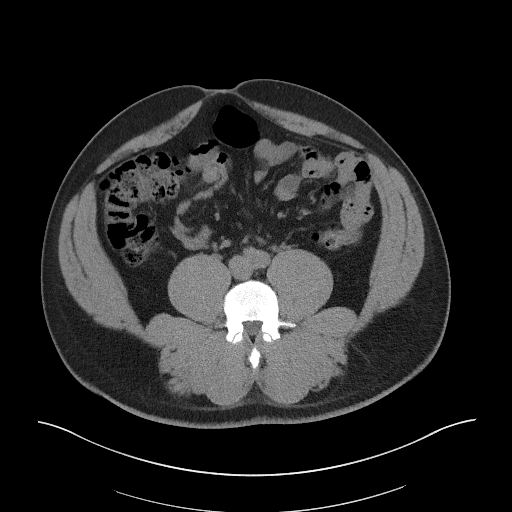
[im 53/95  soft-tissue]
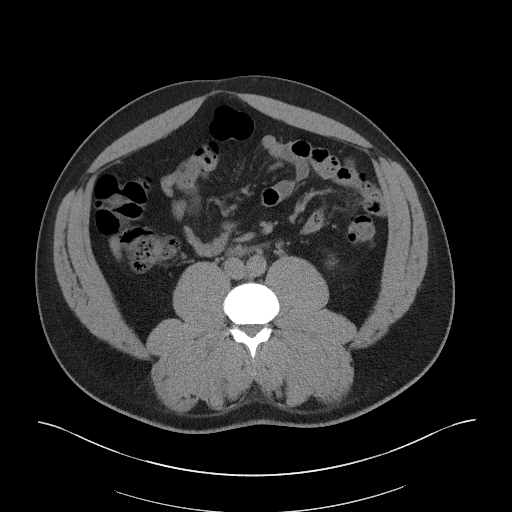
[im 61/95  soft-tissue]
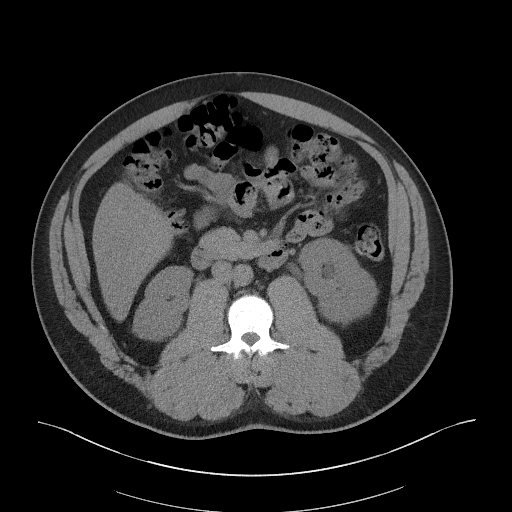
[im 61/95  bone]
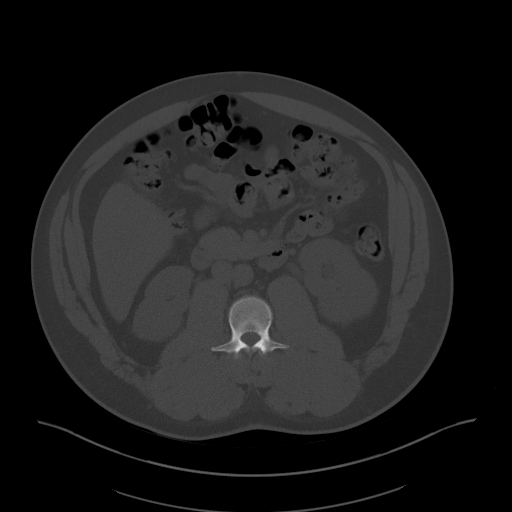
[im 68/95  soft-tissue]
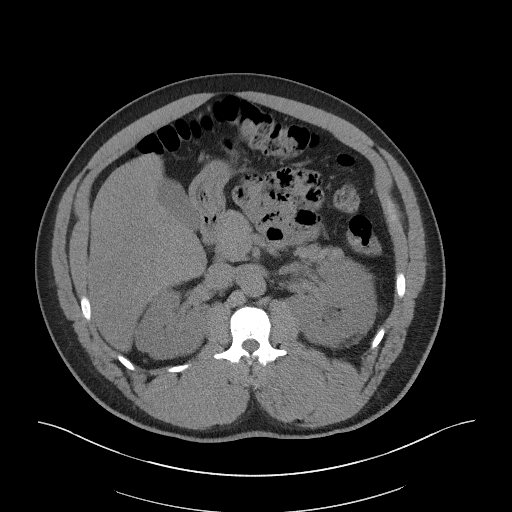
[im 76/95  soft-tissue]
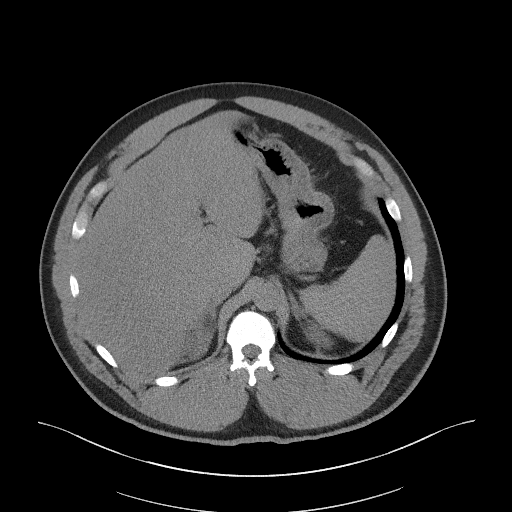
[im 83/95  soft-tissue]
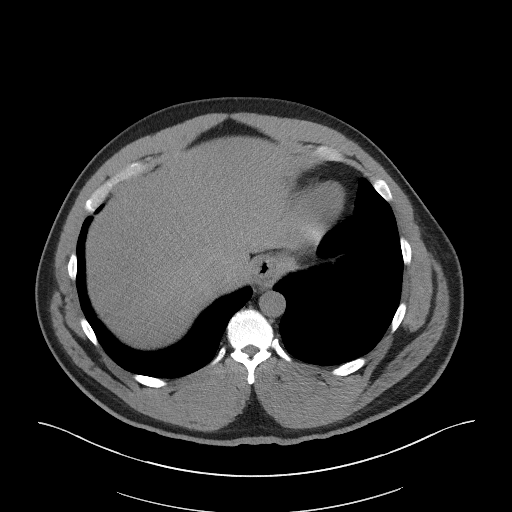
[im 91/95  soft-tissue]
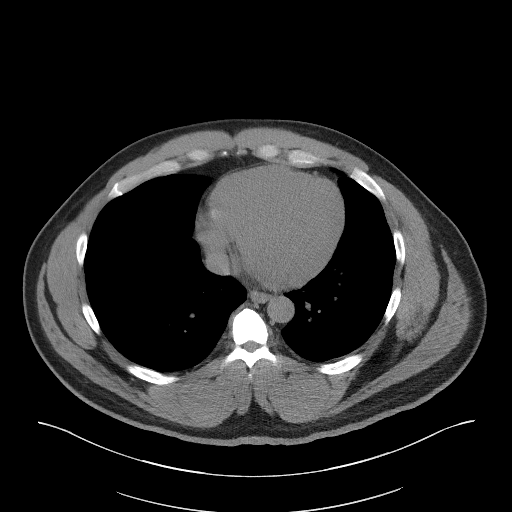

[Series 5: coronal st · coronal · 0.76mm/px · 3 of 115 slices shown]
[im 39/115  soft-tissue]
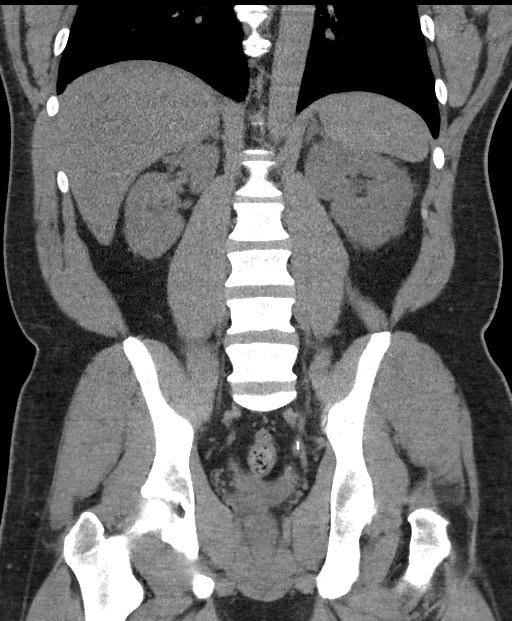
[im 51/115  soft-tissue]
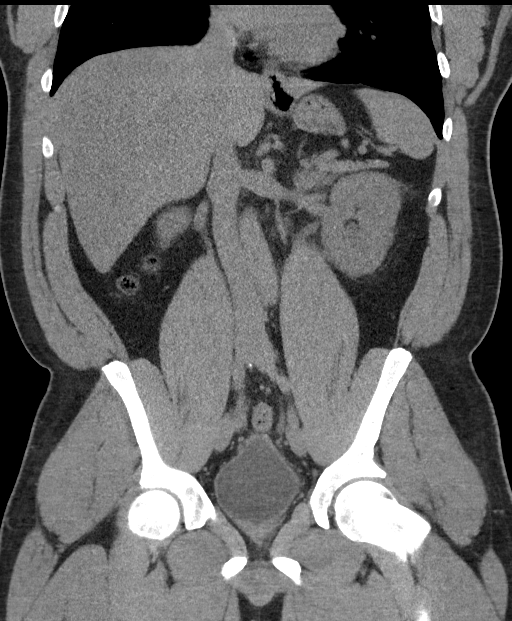
[im 64/115  soft-tissue]
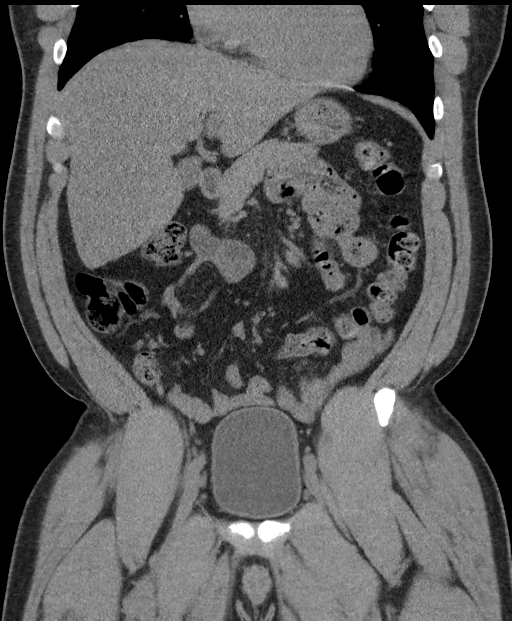

[16 of 46 positions shown; findings below may reference images not displayed]

FINDINGS: Lower chest: No acute abnormality. Normal size heart. No significant
pericardial effusion/thickening. Small hiatal hernia.

Hepatobiliary: Unremarkable noncontrast appearance of the hepatic
parenchyma. Gallbladder is unremarkable. No biliary ductal dilation.

Pancreas: Within normal limits.

Spleen: Within normal limits.

Adrenals/Urinary Tract: Bilateral adrenal glands are unremarkable.

Edematous appearance of the left kidney with periureteric stranding
and mild prominence of the collecting system. Right kidneys
unremarkable. No renal, ureteral or bladder calculi visualized.

Urinary bladder is grossly unremarkable for degree of distension.

Stomach/Bowel: Small hiatal hernia otherwise the stomach is grossly
unremarkable for degree of distension. Normal positioning of the
duodenum/ligament of [place]. No pathologic dilation of small bowel.
The appendix and terminal ileum within normal limits. Scattered
left-sided colonic diverticula without findings of acute
diverticulitis.

Vascular/Lymphatic: Aortic atherosclerosis. No enlarged abdominal or
pelvic lymph nodes.

Reproductive: Prostate is unremarkable.

Other: No abdominopelvic ascites.  No pneumoperitoneum.

Musculoskeletal: No acute osseous abnormality.
IMPRESSION: 1. Edematous appearance of the left kidney with periureteric
stranding and mild prominence of the collecting system. No renal,
ureteral, or bladder calculi visualized. Findings may reflect a
recently passed stone or ascending urinary tract infection.
Correlation with urinalysis recommended.
2. Colonic diverticulosis without findings of acute diverticulitis.
3. Small hiatal hernia.
4. Aortic atherosclerosis.

Aortic Atherosclerosis ([id number].O).
